# Patient Record
Sex: Male | Born: 2000 | Race: White | Hispanic: No | Marital: Single | Smoking: Never smoker
Health system: Southern US, Community
[De-identification: ages and names within clinical notes are randomized; demographics above are authoritative.]

## PROBLEM LIST (undated history)

## (undated) DIAGNOSIS — F988 Other specified behavioral and emotional disorders with onset usually occurring in childhood and adolescence: Secondary | ICD-10-CM

## (undated) HISTORY — PX: HAND SURGERY: SHX662

---
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
## 2021-06-05 ENCOUNTER — Emergency Department (HOSPITAL_BASED_OUTPATIENT_CLINIC_OR_DEPARTMENT_OTHER)
Admission: EM | Admit: 2021-06-05 | Discharge: 2021-06-05 | Disposition: A | Discharge status: Home / Self Care | Payer: BC Managed Care – PPO | Attending: Emergency Medicine | Admitting: Emergency Medicine

## 2021-06-05 ENCOUNTER — Encounter (HOSPITAL_BASED_OUTPATIENT_CLINIC_OR_DEPARTMENT_OTHER): Payer: Self-pay | Admitting: Emergency Medicine

## 2021-06-05 ENCOUNTER — Emergency Department (HOSPITAL_BASED_OUTPATIENT_CLINIC_OR_DEPARTMENT_OTHER): Payer: BC Managed Care – PPO

## 2021-06-05 ENCOUNTER — Other Ambulatory Visit: Payer: Self-pay

## 2021-06-05 DIAGNOSIS — M7989 Other specified soft tissue disorders: Secondary | ICD-10-CM | POA: Diagnosis not present

## 2021-06-05 DIAGNOSIS — S93401A Sprain of unspecified ligament of right ankle, initial encounter: Secondary | ICD-10-CM | POA: Diagnosis not present

## 2021-06-05 DIAGNOSIS — S99911A Unspecified injury of right ankle, initial encounter: Secondary | ICD-10-CM | POA: Diagnosis present

## 2021-06-05 DIAGNOSIS — Y9367 Activity, basketball: Secondary | ICD-10-CM | POA: Diagnosis not present

## 2021-06-05 DIAGNOSIS — X501XXA Overexertion from prolonged static or awkward postures, initial encounter: Secondary | ICD-10-CM | POA: Diagnosis not present

## 2021-06-05 DIAGNOSIS — S93409A Sprain of unspecified ligament of unspecified ankle, initial encounter: Secondary | ICD-10-CM

## 2021-06-05 NOTE — ED Provider Notes (Signed)
?[facility] ?Provider Note ? ? ?CSN: [id number] ?Arrival date & time: 06/05/21  2015 ? ?  ? ?History ? ?Chief Complaint  ?Patient presents with  ? Ankle Injury  ? ? ?ONIE MUSCOLINO is a 21 y.o. male.  Presented to ER for right ankle injury.  Patient states he was playing basketball when he landed awkwardly on his right ankle, twisting his ankle severely.  Noted sudden onset of pain.  Has had difficulty walking due to the pain.  He denies any other injuries.  No major medical problems, no prior injuries to this ankle. ? ?Pain at rest is minimal.  Worsened with movement, finding related. ? ?HPI ? ?  ? ?Home Medications ?Prior to Admission medications   ?Medication Sig Start Date End Date Taking? Authorizing Provider  ?methylphenidate 36 MG PO CR tablet Take 72 mg by mouth daily.    [provider]  ?   ? ?Allergies    ?Codeine   ? ?Review of Systems   ?Review of Systems  ?Musculoskeletal:  Positive for arthralgias.  ?All other systems reviewed and are negative. ? ?Physical Exam ?Updated Vital Signs ?BP (!) 143/76 (BP Location: Right Arm)   Pulse (!) 111   Temp 98.4 ?F (36.9 ?C) (Oral)   Resp 18   Ht 6\' 2"  (1.88 m)   Wt 108 kg   SpO2 99%   BMI 30.56 kg/m?  ?Physical Exam ?Vitals and nursing note reviewed.  ?Constitutional:   ?   General: He is not in acute distress. ?   Appearance: He is well-developed.  ?HENT:  ?   Head: Normocephalic and atraumatic.  ?Eyes:  ?   Conjunctiva/sclera: Conjunctivae normal.  ?Cardiovascular:  ?   Rate and Rhythm: Normal rate.  ?   Pulses: Normal pulses.  ?Pulmonary:  ?   Effort: Pulmonary effort is normal. No respiratory distress.  ?Musculoskeletal:     ?   General: Swelling present.  ?   Cervical back: Neck supple.  ?   Comments: Right lower extremity: There is some swelling to the ankle, tenderness over the lateral and medial malleoli, normal DP PT pulses  ?Skin: ?   General: Skin is warm and dry.  ?   Capillary Refill: Capillary refill  takes less than 2 seconds.  ?Neurological:  ?   General: No focal deficit present.  ?   Mental Status: He is alert.  ?Psychiatric:     ?   Mood and Affect: Mood normal.  ? ? ?ED Results / Procedures / Treatments   ?Labs ?(all labs ordered are listed, but only abnormal results are displayed) ?Labs Reviewed - No data to display ? ?EKG ?None ? ?Radiology ?DG Ankle Complete Right ? ?Result Date: 06/05/2021 ?CLINICAL DATA:  Right ankle injury EXAM: RIGHT ANKLE - COMPLETE 3+ VIEW COMPARISON:  None Available. FINDINGS: No acute fracture or dislocation identified. Ankle mortise is preserved. Soft tissue swelling of the lateral ankle. IMPRESSION: Soft tissue swelling with no acute osseous abnormality identified. Electronically Signed   By: Ofilia Neas M.D.   On: 06/05/2021 20:51   ? ?Procedures ?Procedures  ? ? ?Medications Ordered in ED ?Medications - No data to display ? ?ED Course/ Medical Decision Making/ A&P ?  ?                        ?Medical Decision Making ?Amount and/or Complexity of Data Reviewed ?Radiology: ordered. ? ? ?21 year old boy presents to ER  due to concern for right ankle pain after possible injury.  On exam he has obvious swelling to the ankle with some tenderness.  I independently reviewed x-ray, radiology report, no evidence for fracture or dislocation.  Offered pain medicine in ER but patient states pain is currently well controlled.  Recommend rest, ice, elevate, bear weight as tolerated.  Provided a ankle brace.  Offered crutches but patient reports he has a set of crutches at home.  Father at bedside will take patient home.  Recommended follow-up with sports medicine orthopedics. ? ? ? ?After the discussed management above, the patient was determined to be safe for discharge.  The patient was in agreement with this plan and all questions regarding their care were answered.  ED return precautions were discussed and the patient will return to the ED with any significant worsening of  condition. ? ? ? ? ? ? ? ? ?Final Clinical Impression(s) / ED Diagnoses ?Final diagnoses:  ?Sprain of ankle, unspecified laterality, unspecified ligament, initial encounter  ? ? ?Rx / DC Orders ?ED Discharge Orders   ? ? None  ? ?  ? ? ?  ?Lucrezia Starch, MD ?06/05/21 2318 ? ?

## 2021-06-05 NOTE — Discharge Instructions (Addendum)
Recommend following up with orthopedics or sports medicine.  Take Tylenol, Motrin as needed for pain control.  Recommend bearing weight as tolerated.  If you are having significant pain with any weight then I would recommend getting around with crutches.  Use ankle brace for support. ?

## 2021-06-05 NOTE — ED Triage Notes (Signed)
Patient arrived via POV c/o right ankle injury x 1 hr pta. Patient states going up for rebound playing basketball, coming down with ankle going left and body going right. Patient has obvious swelling. Patient states 3/10 pain now, with 8/10 with pressure. Patient is AO x 4, VS WDL, unable to stand. ?
